# Patient Record
Sex: Female | Born: 1975 | Race: Black or African American | Hispanic: No | Marital: Married | Smoking: Never smoker
Health system: Southern US, Community
[De-identification: ages and names within clinical notes are randomized; demographics above are authoritative.]

---

## 2009-02-13 ENCOUNTER — Emergency Department (HOSPITAL_COMMUNITY): Admission: EM | Admit: 2009-02-13 | Discharge: 2009-02-13 | Payer: Self-pay | Admitting: Emergency Medicine

## 2010-06-20 ENCOUNTER — Emergency Department (HOSPITAL_COMMUNITY): Admission: EM | Admit: 2010-06-20 | Discharge: 2010-06-20 | Payer: Self-pay | Admitting: Emergency Medicine

## 2010-12-23 LAB — URINE MICROSCOPIC-ADD ON

## 2010-12-23 LAB — COMPREHENSIVE METABOLIC PANEL
Albumin: 3.5 g/dL (ref 3.5–5.2)
BUN: 10 mg/dL (ref 6–23)
Chloride: 106 mEq/L (ref 96–112)
Creatinine, Ser: 0.85 mg/dL (ref 0.4–1.2)
Glucose, Bld: 147 mg/dL — ABNORMAL HIGH (ref 70–99)
Total Bilirubin: 0.4 mg/dL (ref 0.3–1.2)
Total Protein: 7.5 g/dL (ref 6.0–8.3)

## 2010-12-23 LAB — URINE CULTURE
Colony Count: >=100000
Culture  Setup Time: 201109112212

## 2010-12-23 LAB — URINALYSIS, ROUTINE W REFLEX MICROSCOPIC
Glucose, UA: NEGATIVE mg/dL
Ketones, ur: 15 mg/dL — AB
Nitrite: POSITIVE — AB
Protein, ur: 30 mg/dL — AB
Specific Gravity, Urine: 1.031 — ABNORMAL HIGH (ref 1.005–1.030)
Urobilinogen, UA: 1 mg/dL (ref 0.0–1.0)
pH: 5 (ref 5.0–8.0)

## 2010-12-23 LAB — CBC
HCT: 36.2 % (ref 36.0–46.0)
Hemoglobin: 11.3 g/dL — ABNORMAL LOW (ref 12.0–15.0)
MCH: 26.7 pg (ref 26.0–34.0)
MCHC: 31.2 g/dL (ref 30.0–36.0)
MCV: 85.4 fL (ref 78.0–100.0)
Platelets: 224 10*3/uL (ref 150–400)
RBC: 4.24 MIL/uL (ref 3.87–5.11)
RDW: 13.7 % (ref 11.5–15.5)
WBC: 9.2 10*3/uL (ref 4.0–10.5)

## 2010-12-23 LAB — DIFFERENTIAL
Basophils Absolute: 0 10*3/uL (ref 0.0–0.1)
Lymphocytes Relative: 11 % — ABNORMAL LOW (ref 12–46)
Monocytes Absolute: 0.4 10*3/uL (ref 0.1–1.0)
Neutro Abs: 7.7 10*3/uL (ref 1.7–7.7)
Neutrophils Relative %: 83 % — ABNORMAL HIGH (ref 43–77)

## 2010-12-23 LAB — LIPASE, BLOOD: Lipase: 24 U/L (ref 11–59)

## 2010-12-23 LAB — GC/CHLAMYDIA PROBE AMP, GENITAL: GC Probe Amp, Genital: NEGATIVE

## 2010-12-23 LAB — POCT PREGNANCY, URINE: Preg Test, Ur: NEGATIVE

## 2011-08-05 IMAGING — CT CT ABD-PELV W/ CM
2 of 4 series · 17 of 46 positions shown, 19 images · IV contrast (water & 100ml omni 300)
Comparison: None.

CLINICAL DATA: Diffuse abdominal pain and cramping.  Nausea
vomiting.

CT ABDOMEN AND PELVIS WITH CONTRAST
TECHNIQUE: Multidetector CT imaging of the abdomen and pelvis was
performed following the standard protocol during bolus
administration of intravenous contrast.
Contrast: 100 ml Mmnipaque-XQQ

[Series 2: routine abdomen · axial · 0.73mm/px · z∈[-356,-16]mm · 14 of 76 slices shown, 16 images]
[im 4/76  soft-tissue]
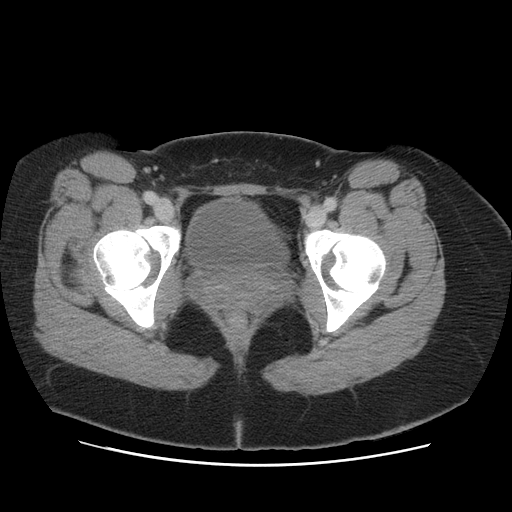
[im 4/76  bone]
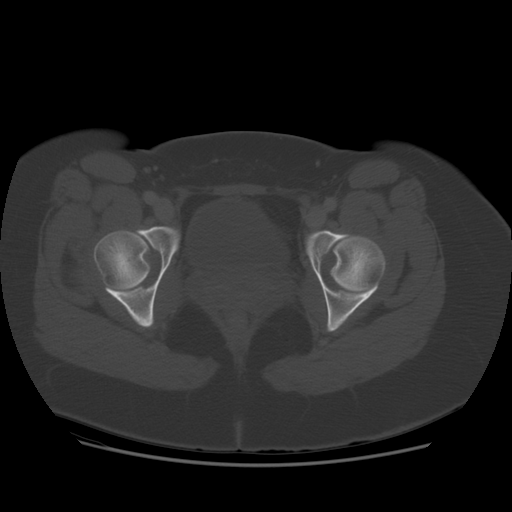
[im 11/76  soft-tissue]
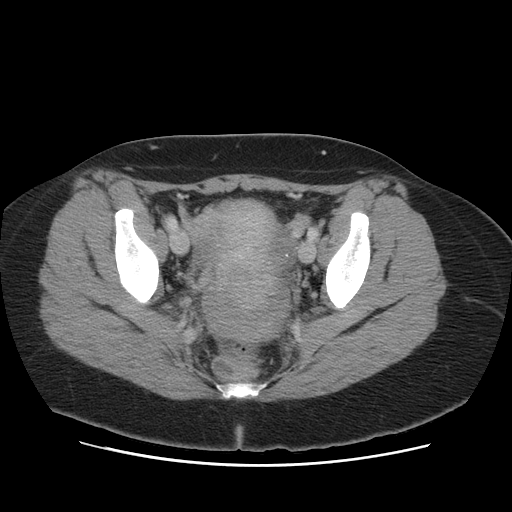
[im 14/76  soft-tissue]
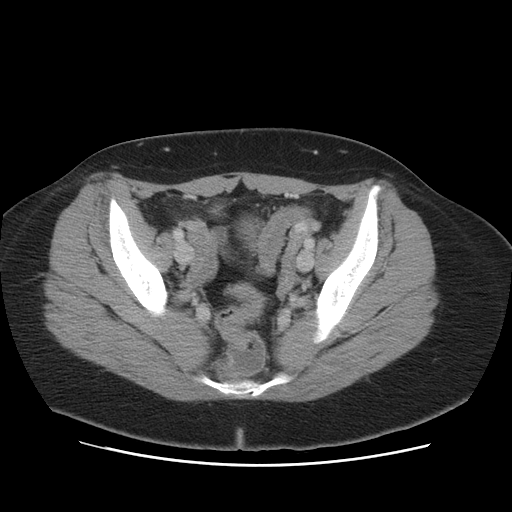
[im 21/76  soft-tissue]
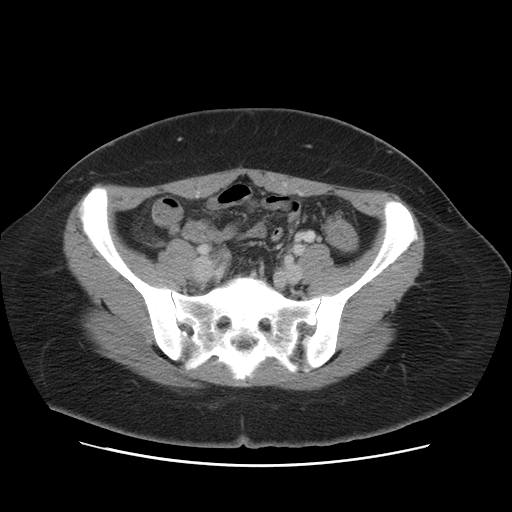
[im 24/76  soft-tissue]
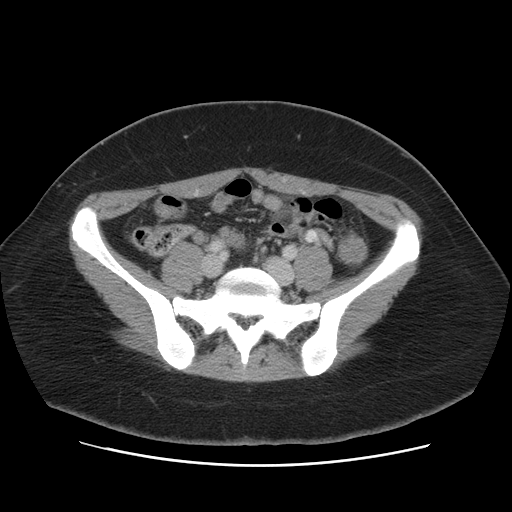
[im 31/76  soft-tissue]
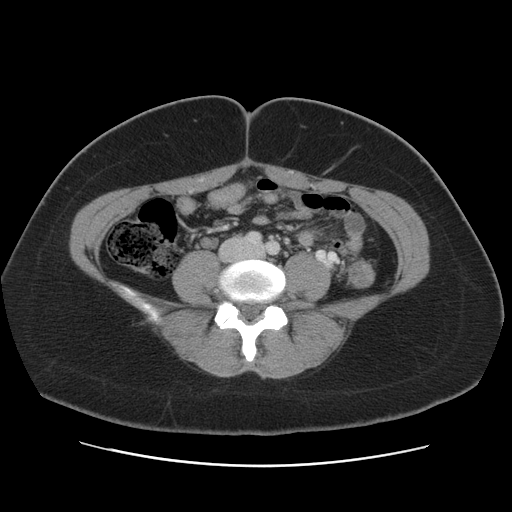
[im 35/76  soft-tissue]
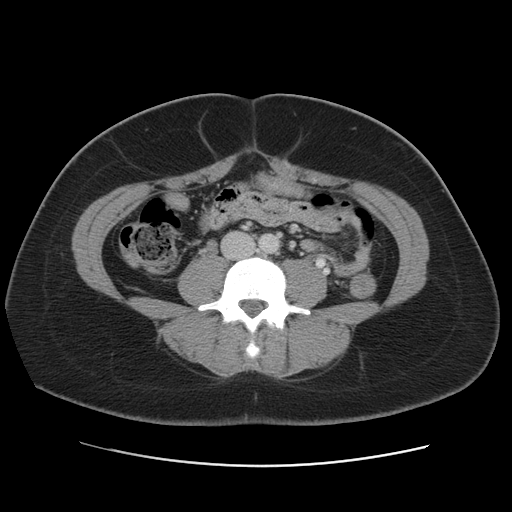
[im 41/76  soft-tissue]
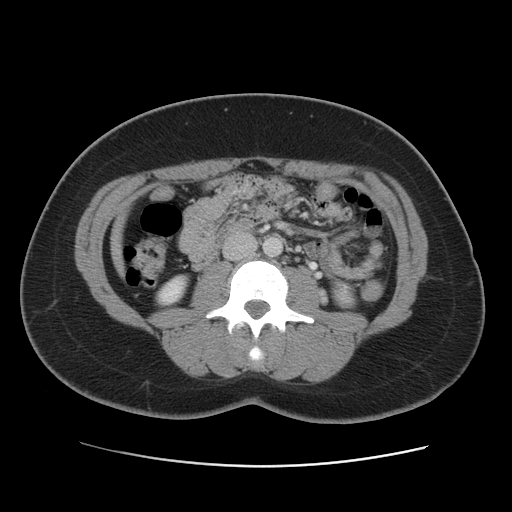
[im 45/76  soft-tissue]
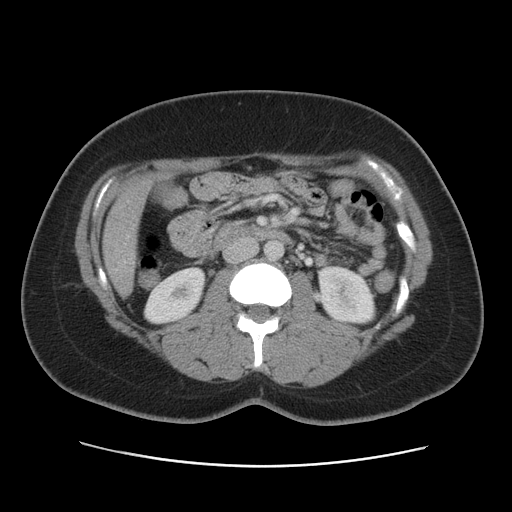
[im 45/76  bone]
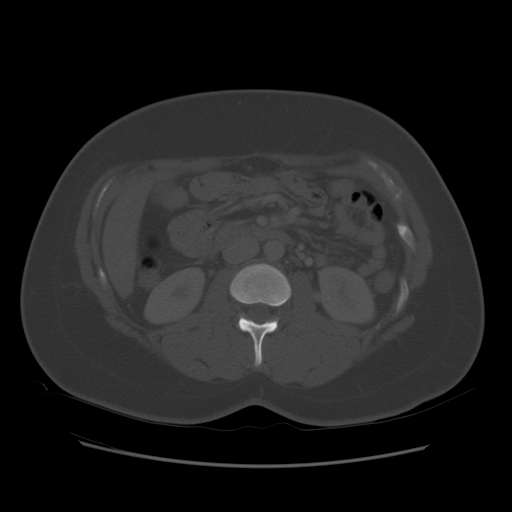
[im 52/76  soft-tissue]
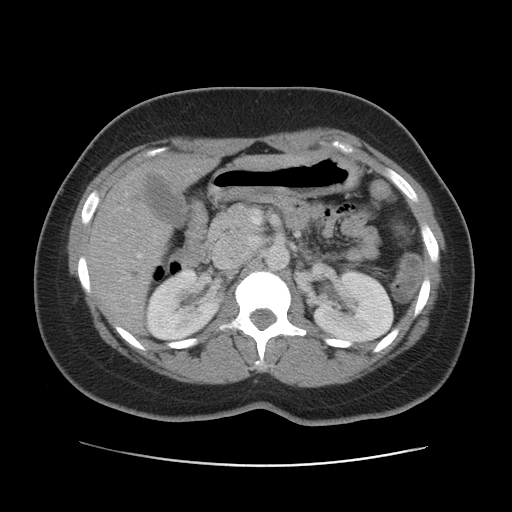
[im 55/76  soft-tissue]
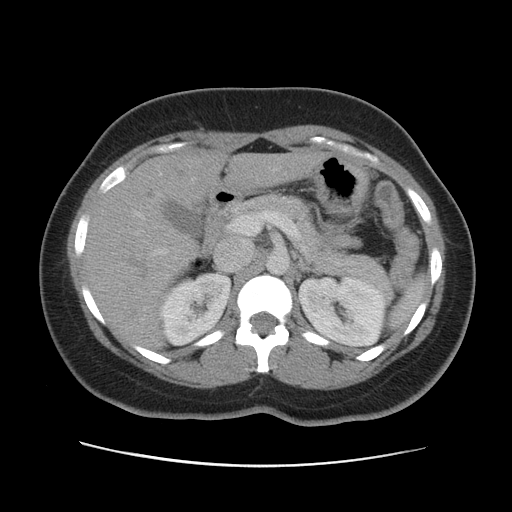
[im 62/76  soft-tissue]
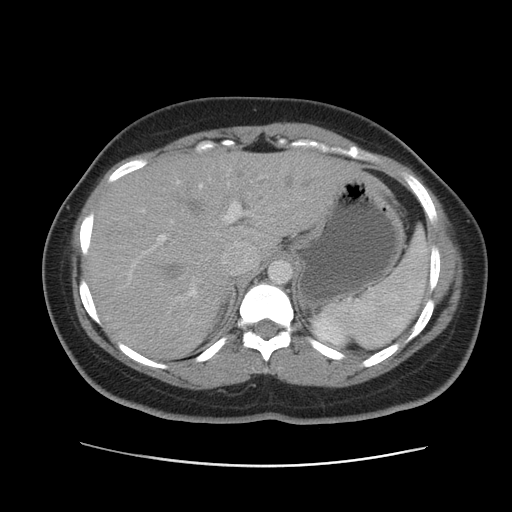
[im 65/76  soft-tissue]
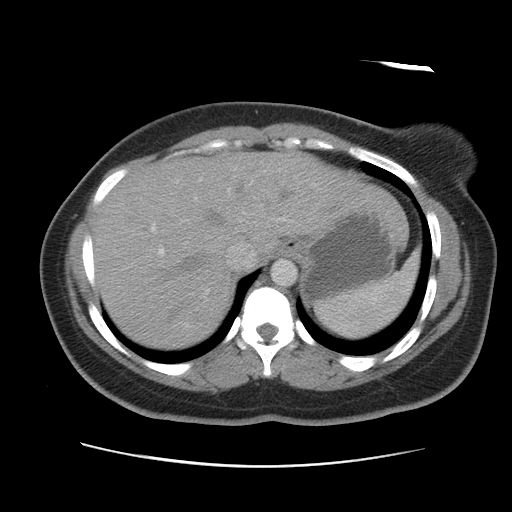
[im 72/76  soft-tissue]
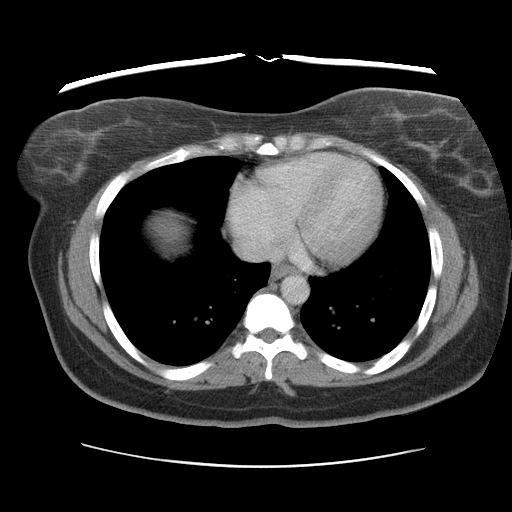

[Series 401: cor · coronal · 0.88mm/px · 3 of 82 slices shown]
[im 28/82  soft-tissue]
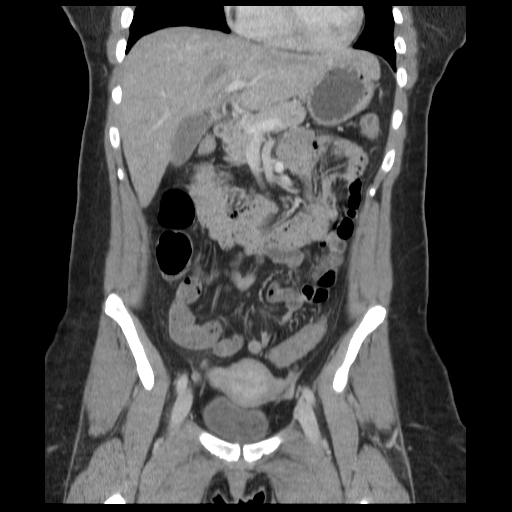
[im 37/82  soft-tissue]
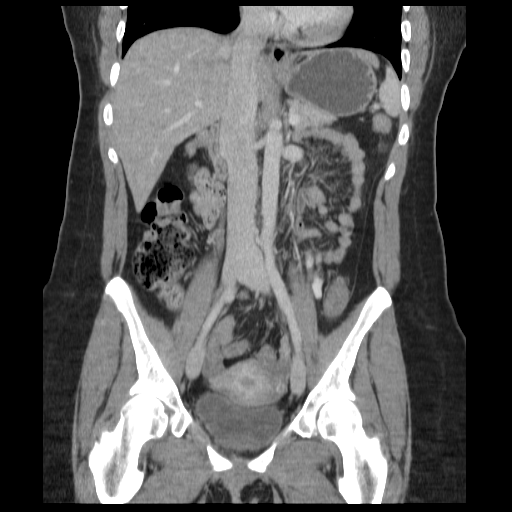
[im 46/82  soft-tissue]
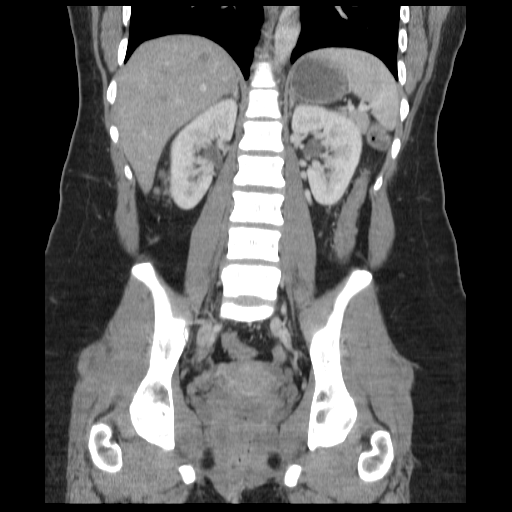

[17 of 46 positions shown; findings below may reference images not displayed]

FINDINGS: The abdominal parenchymal organs are normal in
appearance.  Gallbladder is unremarkable.  No evidence of
hydronephrosis.

No soft tissue masses or lymphadenopathy identified within the
abdomen or pelvis.  No evidence of inflammatory process or abnormal
fluid collections.  Uterus and adnexa are unremarkable.  No
evidence of dilated bowel loops.
IMPRESSION: Negative.  No acute findings or other significant abnormality.

## 2011-09-22 ENCOUNTER — Encounter: Payer: Self-pay | Admitting: *Deleted

## 2011-09-22 ENCOUNTER — Emergency Department (HOSPITAL_COMMUNITY)
Admission: EM | Admit: 2011-09-22 | Discharge: 2011-09-22 | Disposition: A | Discharge status: Home / Self Care | Payer: Self-pay | Attending: Emergency Medicine | Admitting: Emergency Medicine

## 2011-09-22 DIAGNOSIS — R059 Cough, unspecified: Secondary | ICD-10-CM | POA: Insufficient documentation

## 2011-09-22 DIAGNOSIS — R51 Headache: Secondary | ICD-10-CM | POA: Insufficient documentation

## 2011-09-22 DIAGNOSIS — R110 Nausea: Secondary | ICD-10-CM | POA: Insufficient documentation

## 2011-09-22 DIAGNOSIS — R070 Pain in throat: Secondary | ICD-10-CM | POA: Insufficient documentation

## 2011-09-22 DIAGNOSIS — R630 Anorexia: Secondary | ICD-10-CM | POA: Insufficient documentation

## 2011-09-22 DIAGNOSIS — R5381 Other malaise: Secondary | ICD-10-CM | POA: Insufficient documentation

## 2011-09-22 DIAGNOSIS — J111 Influenza due to unidentified influenza virus with other respiratory manifestations: Secondary | ICD-10-CM | POA: Insufficient documentation

## 2011-09-22 DIAGNOSIS — R05 Cough: Secondary | ICD-10-CM | POA: Insufficient documentation

## 2011-09-22 DIAGNOSIS — R509 Fever, unspecified: Secondary | ICD-10-CM | POA: Insufficient documentation

## 2011-09-22 DIAGNOSIS — R5383 Other fatigue: Secondary | ICD-10-CM | POA: Insufficient documentation

## 2011-09-22 DIAGNOSIS — IMO0001 Reserved for inherently not codable concepts without codable children: Secondary | ICD-10-CM | POA: Insufficient documentation

## 2011-09-22 MED ORDER — OSELTAMIVIR PHOSPHATE 75 MG PO CAPS
75.0000 mg | ORAL_CAPSULE | Freq: Once | ORAL | Status: AC
  Administered 2011-09-22: 75 mg via ORAL
  Filled 2011-09-22 (×2): qty 1

## 2011-09-22 MED ORDER — OSELTAMIVIR PHOSPHATE 75 MG PO CAPS: 75.0000 mg | ORAL_CAPSULE | Freq: Once | ORAL | Status: AC

## 2011-09-22 MED ORDER — ACETAMINOPHEN 325 MG PO TABS
650.0000 mg | ORAL_TABLET | Freq: Once | ORAL | Status: AC
  Administered 2011-09-22: 650 mg via ORAL
  Filled 2011-09-22: qty 2

## 2011-09-22 NOTE — ED Notes (Signed)
Pt has been having body aches for 2 days with congestion and cold starting today.  Pt is feeling increasingly sick and has been having diarrhea with this beginning today.  Pt has been having fever and chills.   No abdominal pain.

## 2011-09-22 NOTE — ED Provider Notes (Signed)
Medical screening examination/treatment/procedure(s) were performed by non-physician practitioner and as supervising physician I was immediately available for consultation/collaboration.  Oree Mirelez L Teron Blais, MD 09/22/11 2338 

## 2011-09-22 NOTE — ED Provider Notes (Signed)
History     CSN: [id number] Arrival date & time: 09/22/2011  5:43 PM   First MD Initiated Contact with Patient 09/22/11 2003      Chief Complaint  Patient presents with  . Influenza    (Consider location/radiation/quality/duration/timing/severity/associated sxs/prior treatment) HPI Comments: Patient here with a day history of fever, chills, headache, cough, body aches - states been trying theraflu at home without relief.  Patient is a 35 y.o. female presenting with flu symptoms. The history is provided by the patient and the spouse. No language interpreter was used.  Influenza This is a new problem. The current episode started yesterday. The problem occurs constantly. The problem has been unchanged. Associated symptoms include anorexia, chills, congestion, coughing, fatigue, a fever, headaches, myalgias, nausea, a sore throat and weakness. Pertinent negatives include no abdominal pain, arthralgias, chest pain, diaphoresis, neck pain, numbness, rash, urinary symptoms, visual change or vomiting. The symptoms are aggravated by nothing. She has tried acetaminophen, NSAIDs, relaxation and rest for the symptoms. The treatment provided mild relief.    History reviewed. No pertinent past medical history.  History reviewed. No pertinent past surgical history.  No family history on file.  History  Substance Use Topics  . Smoking status: Not on file  . Smokeless tobacco: Not on file  . Alcohol Use: No    OB History    Grav Para Term Preterm Abortions TAB SAB Ect Mult Living                  Review of Systems  Constitutional: Positive for fever, chills and fatigue. Negative for diaphoresis.  HENT: Positive for congestion and sore throat. Negative for neck pain.   Respiratory: Positive for cough.   Cardiovascular: Negative for chest pain.  Gastrointestinal: Positive for nausea and anorexia. Negative for vomiting and abdominal pain.  Musculoskeletal: Positive for myalgias. Negative  for arthralgias.  Skin: Negative for rash.  Neurological: Positive for weakness and headaches. Negative for numbness.  All other systems reviewed and are negative.    Allergies  Review of patient's allergies indicates no known allergies.  Home Medications   Current Outpatient Rx  Name Route Sig Dispense Refill  . THERAFLU FLU/COLD PO Oral Take 1 packet by mouth daily. Mix one packet with hot liquid     . DM-PHENYLEPHRINE-ACETAMINOPHEN 10-5-325 MG/15ML PO LIQD Oral Take 30 mLs by mouth every 4 (four) hours.        BP 114/71  Pulse 84  Temp(Src) 100.1 F (37.8 C) (Oral)  Resp 20  SpO2 99%  Physical Exam  Nursing note and vitals reviewed. Constitutional: She is oriented to person, place, and time. She appears well-developed and well-nourished. No distress.  HENT:  Head: Normocephalic and atraumatic.  Right Ear: External ear normal.  Left Ear: External ear normal.  Nose: Nose normal.  Mouth/Throat: Oropharynx is clear and moist. No oropharyngeal exudate.       Nasal mucosa boggy - mild erythematous streaking in oropharynx  Eyes: Conjunctivae and EOM are normal. Pupils are equal, round, and reactive to light.  Neck: Normal range of motion. Neck supple.  Cardiovascular: Normal rate, regular rhythm and normal heart sounds.  Exam reveals no gallop and no friction rub.   No murmur heard. Pulmonary/Chest: Effort normal and breath sounds normal. No respiratory distress. She has no wheezes. She has no rales. She exhibits no tenderness.  Abdominal: Soft. Bowel sounds are normal. There is no tenderness.  Musculoskeletal: Normal range of motion.  Lymphadenopathy:    She  has no cervical adenopathy.  Neurological: She is alert and oriented to person, place, and time. No cranial nerve deficit.  Skin: Skin is warm and dry.  Psychiatric: She has a normal mood and affect. Her behavior is normal. Judgment and thought content normal.    ED Course  Procedures (including critical care  time)  Labs Reviewed - No data to display No results found.   Influenza    MDM  Patient with symptoms of the flu - less than 2 days in duration - will start tamiflu        Scarlette Calico C. [place], [place] 09/22/11 2033

## 2014-01-21 ENCOUNTER — Other Ambulatory Visit: Payer: Self-pay

## 2014-01-21 ENCOUNTER — Emergency Department (HOSPITAL_COMMUNITY): Payer: Self-pay

## 2014-01-21 ENCOUNTER — Emergency Department (HOSPITAL_COMMUNITY)
Admission: EM | Admit: 2014-01-21 | Discharge: 2014-01-21 | Disposition: A | Discharge status: Home / Self Care | Payer: Self-pay | Attending: Emergency Medicine | Admitting: Emergency Medicine

## 2014-01-21 ENCOUNTER — Encounter (HOSPITAL_COMMUNITY): Payer: Self-pay | Admitting: Emergency Medicine

## 2014-01-21 DIAGNOSIS — Z79899 Other long term (current) drug therapy: Secondary | ICD-10-CM | POA: Insufficient documentation

## 2014-01-21 DIAGNOSIS — J3489 Other specified disorders of nose and nasal sinuses: Secondary | ICD-10-CM | POA: Insufficient documentation

## 2014-01-21 DIAGNOSIS — R509 Fever, unspecified: Secondary | ICD-10-CM | POA: Insufficient documentation

## 2014-01-21 DIAGNOSIS — R0602 Shortness of breath: Secondary | ICD-10-CM | POA: Insufficient documentation

## 2014-01-21 DIAGNOSIS — R05 Cough: Secondary | ICD-10-CM | POA: Insufficient documentation

## 2014-01-21 DIAGNOSIS — R059 Cough, unspecified: Secondary | ICD-10-CM | POA: Insufficient documentation

## 2014-01-21 LAB — CBC WITH DIFFERENTIAL/PLATELET
BASOS PCT: 1 % (ref 0–1)
Basophils Absolute: 0.1 10*3/uL (ref 0.0–0.1)
Eosinophils Absolute: 0.1 10*3/uL (ref 0.0–0.7)
Eosinophils Relative: 1 % (ref 0–5)
HCT: 36.4 % (ref 36.0–46.0)
HEMOGLOBIN: 11.4 g/dL — AB (ref 12.0–15.0)
LYMPHS ABS: 2.2 10*3/uL (ref 0.7–4.0)
Lymphocytes Relative: 23 % (ref 12–46)
MCH: 26.5 pg (ref 26.0–34.0)
MCHC: 31.3 g/dL (ref 30.0–36.0)
MCV: 84.5 fL (ref 78.0–100.0)
MONOS PCT: 5 % (ref 3–12)
Monocytes Absolute: 0.5 10*3/uL (ref 0.1–1.0)
NEUTROS ABS: 6.7 10*3/uL (ref 1.7–7.7)
NEUTROS PCT: 70 % (ref 43–77)
Platelets: 280 10*3/uL (ref 150–400)
RBC: 4.31 MIL/uL (ref 3.87–5.11)
RDW: 14.1 % (ref 11.5–15.5)
WBC: 9.6 10*3/uL (ref 4.0–10.5)

## 2014-01-21 LAB — BASIC METABOLIC PANEL
BUN: 13 mg/dL (ref 6–23)
CHLORIDE: 103 meq/L (ref 96–112)
CO2: 26 mEq/L (ref 19–32)
Calcium: 9.2 mg/dL (ref 8.4–10.5)
Creatinine, Ser: 0.81 mg/dL (ref 0.50–1.10)
GFR calc non Af Amer: >90 mL/min (ref >90)
GLUCOSE: 89 mg/dL (ref 70–99)
POTASSIUM: 4.4 meq/L (ref 3.7–5.3)
Sodium: 140 mEq/L (ref 137–147)

## 2014-01-21 LAB — PRO B NATRIURETIC PEPTIDE: Pro B Natriuretic peptide (BNP): 24.1 pg/mL (ref 0–125)

## 2014-01-21 LAB — TROPONIN I: Troponin I: <0.3 ng/mL (ref <0.30)

## 2014-01-21 MED ORDER — ALBUTEROL SULFATE HFA 108 (90 BASE) MCG/ACT IN AERS: 2.0000 | INHALATION_SPRAY | RESPIRATORY_TRACT | Status: AC | PRN

## 2014-01-21 MED ORDER — AZITHROMYCIN 250 MG PO TABS
500.0000 mg | ORAL_TABLET | Freq: Once | ORAL | Status: AC
  Administered 2014-01-21: 500 mg via ORAL
  Filled 2014-01-21: qty 2

## 2014-01-21 MED ORDER — AZITHROMYCIN 250 MG PO TABS: 250.0000 mg | ORAL_TABLET | Freq: Every day | ORAL | Status: AC

## 2014-01-21 MED ORDER — BENZONATATE 100 MG PO CAPS: 100.0000 mg | ORAL_CAPSULE | Freq: Three times a day (TID) | ORAL | Status: AC

## 2014-01-21 NOTE — ED Provider Notes (Signed)
TIME SEEN: 4:21 AM  CHIEF COMPLAINT: Cough, congestion, shortness of breath with lying flat  HPI: Patient is a 38 rolled female with no significant past medical history who presents emergency department with 2 months of nonproductive cough, nasal congestion shortness of breath with lying flat. She has not been seen by Dr. for the symptoms. She denies any chest pain. She's had subjective fevers and chills. No lower extremity swelling or pain. No history of PE or DVT. No prolonged immobilization, long flight, surgery, trauma, fracture, exogenous hormone use or tobacco use. No history of ACS, hypertension, diabetes, hyperlipidemia. No family history of premature CAD. No history of heart failure.  ROS: See HPI Constitutional: Subjective fever  Eyes: no drainage  ENT: no runny nose   Cardiovascular:  no chest pain  Resp: SOB  GI: no vomiting GU: no dysuria Integumentary: no rash  Allergy: no hives  Musculoskeletal: no leg swelling  Neurological: no slurred speech ROS otherwise negative  PAST MEDICAL HISTORY/PAST SURGICAL HISTORY:  History reviewed. No pertinent past medical history.  MEDICATIONS:  Prior to Admission medications   Medication Sig Start Date End Date Taking? Authorizing Provider  DM-Phenylephrine-Acetaminophen (VICKS DAYQUIL COLD & FLU) 10-5-325 MG/15ML LIQD Take 30 mLs by mouth every 4 (four) hours.     Yes Historical Provider, MD  guaiFENesin (MUCINEX) 600 MG 12 hr tablet Take 600 mg by mouth 2 (two) times daily as needed for cough.   Yes Historical Provider, MD    ALLERGIES:  No Known Allergies  SOCIAL HISTORY:  History  Substance Use Topics  . Smoking status: Never Smoker   . Smokeless tobacco: Not on file  . Alcohol Use: No    FAMILY HISTORY: No family history on file.  EXAM: BP 122/88  Pulse 65  Temp(Src) 98.1 F (36.7 C) (Oral)  Resp 18  Ht 5\' 7"  (1.702 m)  Wt 216 lb (97.977 kg)  BMI 33.82 kg/m2  SpO2 100%  LMP 01/08/2014 CONSTITUTIONAL: Alert  and oriented and responds appropriately to questions. Well-appearing; well-nourished HEAD: Normocephalic EYES: Conjunctivae clear, PERRL ENT: normal nose; no rhinorrhea; moist mucous membranes; pharynx without lesions noted, patient has worse voice but no trismus or drooling, no muffled voice, no uvular deviation NECK: Supple, no meningismus, no LAD  CARD: RRR; S1 and S2 appreciated; no murmurs, no clicks, no rubs, no gallops RESP: Normal chest excursion without splinting or tachypnea; breath sounds clear and equal bilaterally; no wheezes, no rhonchi, no rales,  ABD/GI: Normal bowel sounds; non-distended; soft, non-tender, no rebound, no guarding BACK:  The back appears normal and is non-tender to palpation, there is no CVA tenderness EXT: Normal ROM in all joints; non-tender to palpation; no edema; normal capillary refill; no cyanosis    SKIN: Normal color for age and race; warm NEURO: Moves all extremities equally PSYCH: The patient's mood and manner are appropriate. Grooming and personal hygiene are appropriate.  MEDICAL DECISION MAKING: Patient here with URI symptoms. She is also complaining of shortness of breath with lying flat but denies any exertional dyspnea. No chest pain. No risk factors for ACS. She is PERC negative. Chest x-ray shows no infiltrate or edema. We'll obtain cardiac labs, troponin and BMP. EKG shows no ischemic changes.  ED PROGRESS: Patient's labs are unremarkable. Her BNP is normal. Troponin negative. Given she is constant symptoms for 2 months, do not feel she needs serial set of enzymes. We'll discharge home with prescription for albuterol, Tessalon Perles and azithromycin. Have discussed strict return precautions and supportive  care instructions. Patient verbalizes understanding and is comfortable with this plan.      Date: 01/21/2014 5:03 AM  Rate: 63  Rhythm: normal sinus rhythm  QRS Axis: normal  Intervals: normal  ST/T Wave abnormalities: normal   Conduction Disutrbances: none  Narrative Interpretation: unremarkable; nonspecific ST flattening, no old for comparison     Danielle MawKristen N Kelita Wallis, DO 01/21/14 [id number]

## 2014-01-21 NOTE — ED Notes (Signed)
Pt ambulatory to room without issue.  

## 2014-01-21 NOTE — Discharge Instructions (Signed)
Cough, Adult ° A cough is a reflex that helps clear your throat and airways. It can help heal the body or may be a reaction to an irritated airway. A cough may only last 2 or 3 weeks (acute) or may last more than 8 weeks (chronic).  °CAUSES °Acute cough: °· Viral or bacterial infections. °Chronic cough: °· Infections. °· Allergies. °· Asthma. °· Post-nasal drip. °· Smoking. °· Heartburn or acid reflux. °· Some medicines. °· Chronic lung problems (COPD). °· Cancer. °SYMPTOMS  °· Cough. °· Fever. °· Chest pain. °· Increased breathing rate. °· High-pitched whistling sound when breathing (wheezing). °· Colored mucus that you cough up (sputum). °TREATMENT  °· A bacterial cough may be treated with antibiotic medicine. °· A viral cough must run its course and will not respond to antibiotics. °· Your caregiver may recommend other treatments if you have a chronic cough. °HOME CARE INSTRUCTIONS  °· Only take over-the-counter or prescription medicines for pain, discomfort, or fever as directed by your caregiver. Use cough suppressants only as directed by your caregiver. °· Use a cold steam vaporizer or humidifier in your bedroom or home to help loosen secretions. °· Sleep in a semi-upright position if your cough is worse at night. °· Rest as needed. °· Stop smoking if you smoke. °SEEK IMMEDIATE MEDICAL CARE IF:  °· You have pus in your sputum. °· Your cough starts to worsen. °· You cannot control your cough with suppressants and are losing sleep. °· You begin coughing up blood. °· You have difficulty breathing. °· You develop pain which is getting worse or is uncontrolled with medicine. °· You have a fever. °MAKE SURE YOU:  °· Understand these instructions. °· Will watch your condition. °· Will get help right away if you are not doing well or get worse. °Document Released: 03/25/2011 Document Revised: 12/19/2011 Document Reviewed: 03/25/2011 °[employer]® Patient Information ©2014 [employer]. ° °Upper Respiratory Infection,  Adult °An upper respiratory infection (URI) is also known as the common cold. It is often caused by a type of germ (virus). Colds are easily spread (contagious). You can pass it to others by kissing, coughing, sneezing, or drinking out of the same glass. Usually, you get better in 1 or 2 weeks.  °HOME CARE  °· Only take medicine as told by your doctor. °· Use a warm mist humidifier or breathe in steam from a hot shower. °· Drink enough water and fluids to keep your pee (urine) clear or pale yellow. °· Get plenty of rest. °· Return to work when your temperature is back to normal or as told by your doctor. You may use a face mask and wash your hands to stop your cold from spreading. °GET HELP RIGHT AWAY IF:  °· After the first few days, you feel you are getting worse. °· You have questions about your medicine. °· You have chills, shortness of breath, or brown or red spit (mucus). °· You have yellow or brown snot (nasal discharge) or pain in the face, especially when you bend forward. °· You have a fever, puffy (swollen) neck, pain when you swallow, or white spots in the back of your throat. °· You have a bad headache, ear pain, sinus pain, or chest pain. °· You have a high-pitched whistling sound when you breathe in and out (wheezing). °· You have a lasting cough or cough up blood. °· You have sore muscles or a stiff neck. °MAKE SURE YOU:  °· Understand these instructions. °· Will watch your   condition. °· Will get help right away if you are not doing well or get worse. °Document Released: 03/14/2008 Document Revised: 12/19/2011 Document Reviewed: 01/31/2011 °[employer]® Patient Information ©2014 [employer]. ° °

## 2014-01-21 NOTE — ED Notes (Signed)
Pt states she has been having a "Cold in my chest" for about one month.  Pt states she is having trouble sleeping when laying flat on her back.

## 2015-03-08 IMAGING — CR DG CHEST 2V
2 series · 2 of 2 positions shown · non-contrast
Comparison: None.

CLINICAL DATA: Cough, chest pain

EXAM:
CHEST  2 VIEW

[w chest pa]
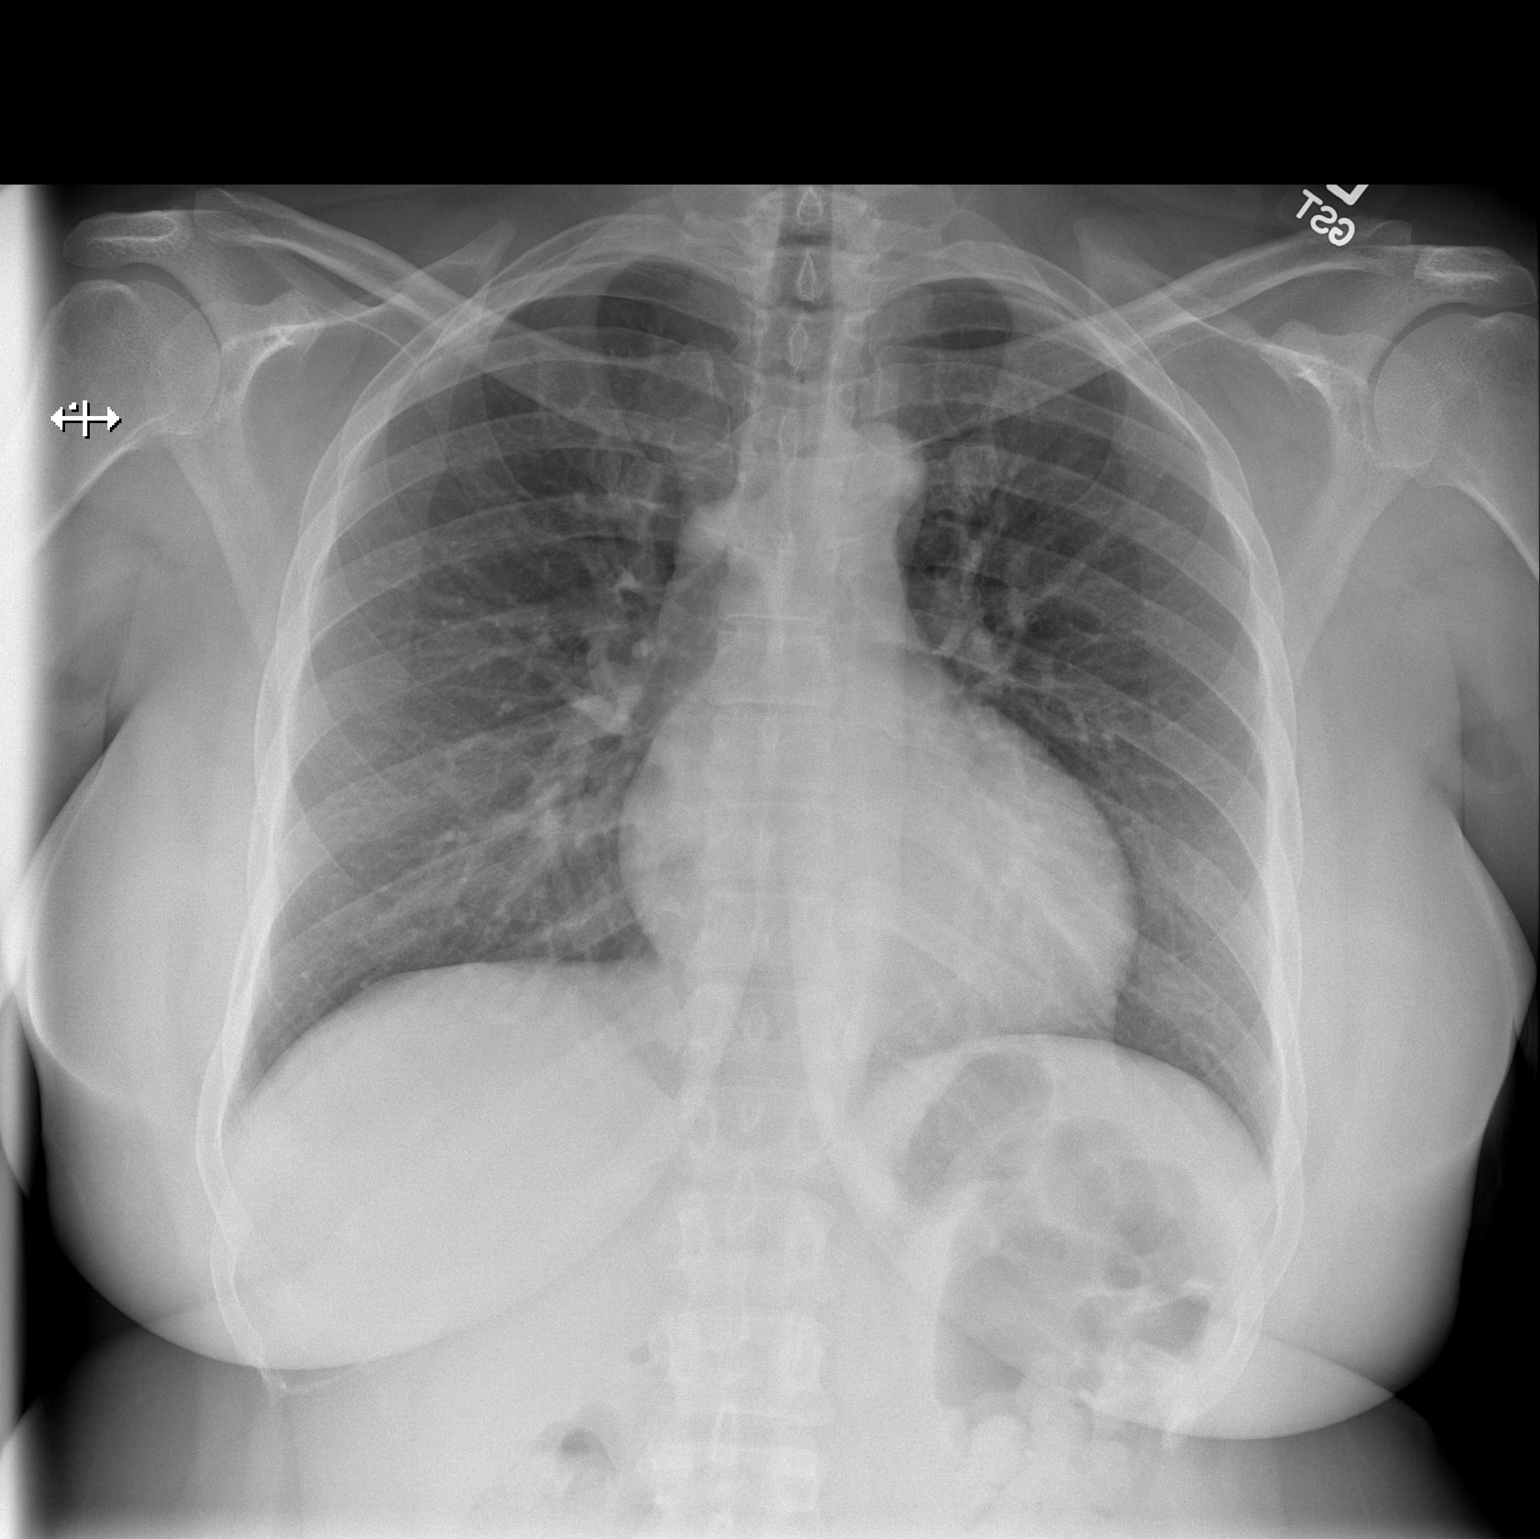

[w chest lat]
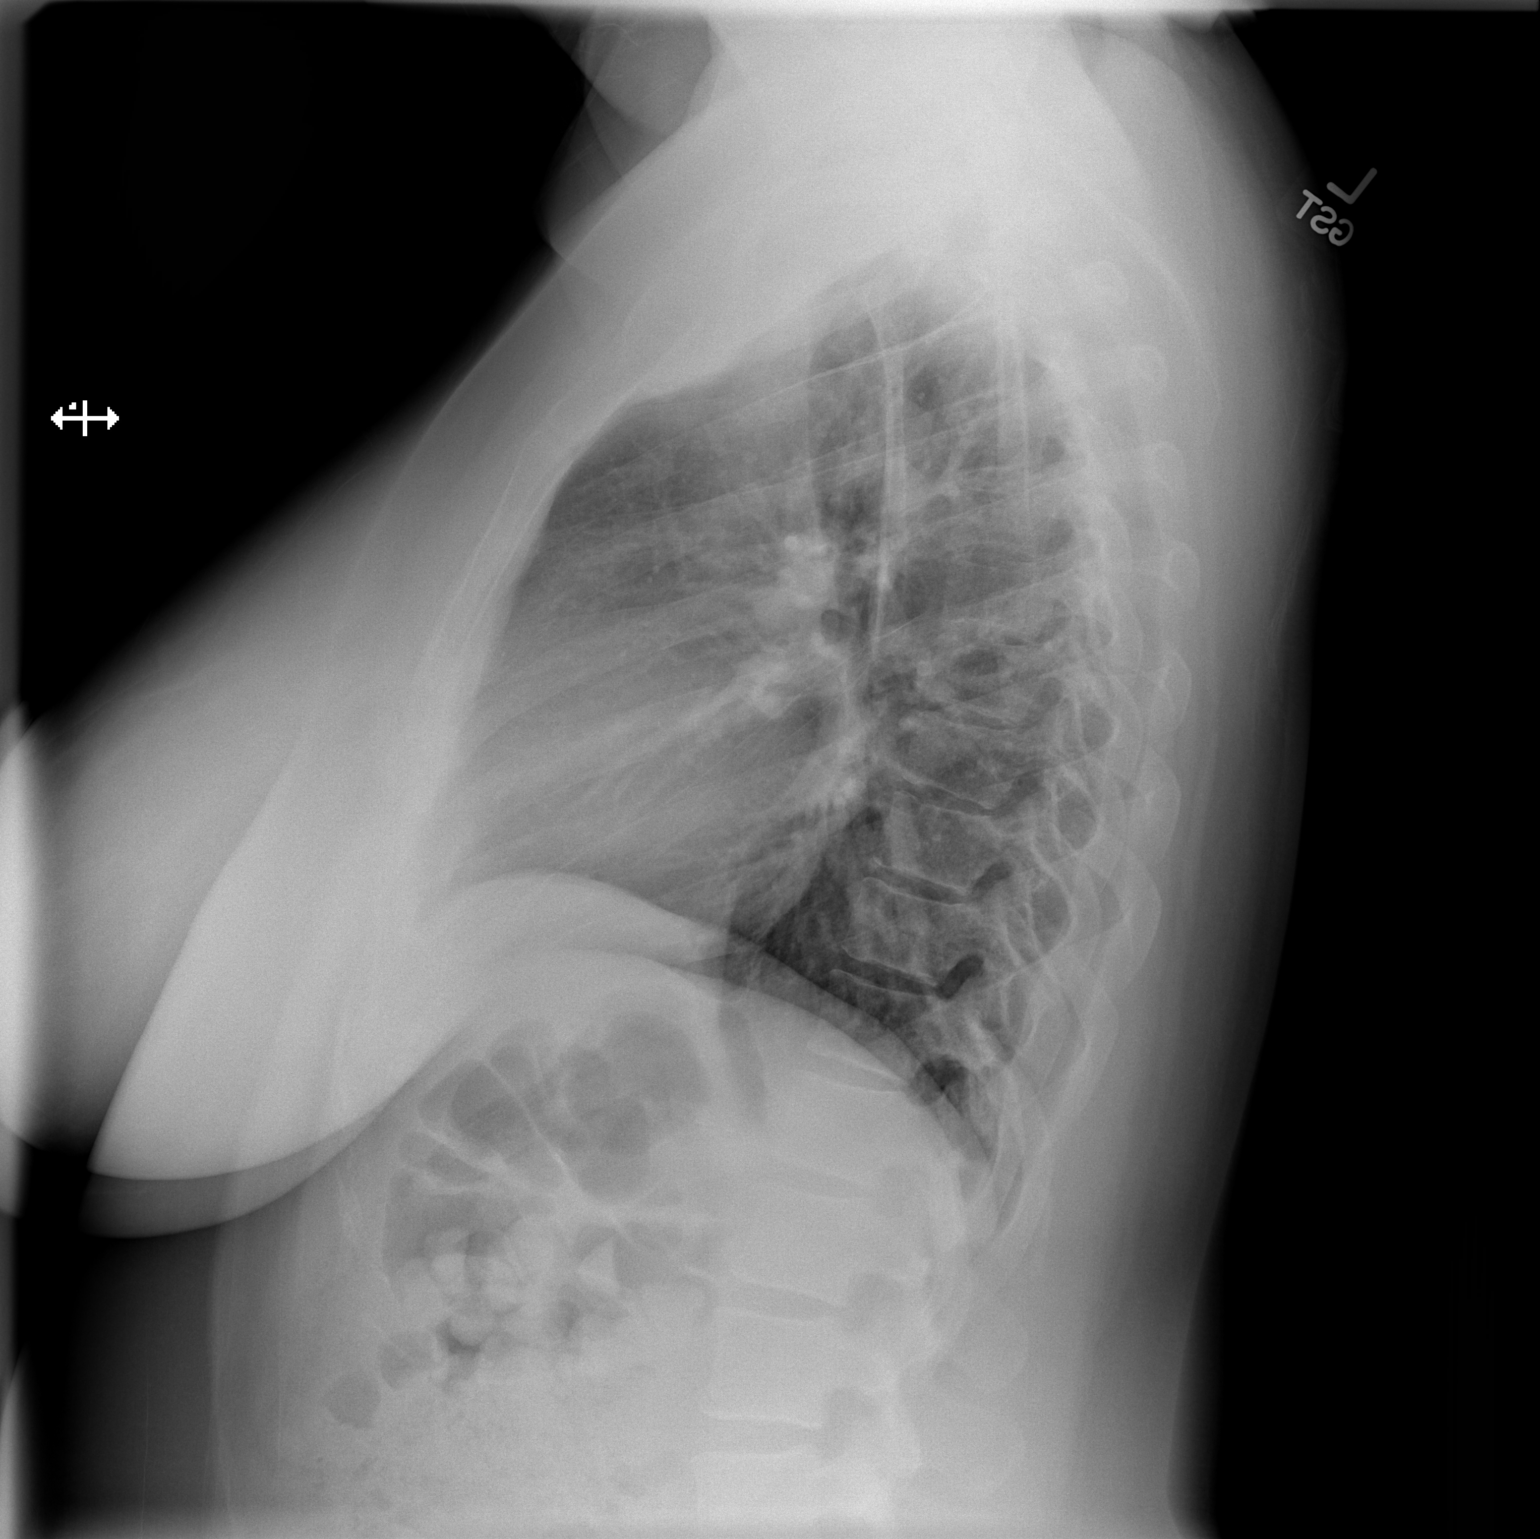

[2 of 2 positions shown; findings below may reference images not displayed]

FINDINGS: The heart size and mediastinal contours are within normal limits.
Both lungs are clear. The visualized skeletal structures are
unremarkable.
IMPRESSION: No active cardiopulmonary disease.
